# Patient Record
Sex: Male | Born: 1950 | Race: White | Hispanic: No | Marital: Married | Smoking: Never smoker
Health system: Southern US, Community
[De-identification: ages and names within clinical notes are randomized; demographics above are authoritative.]

---

## 2004-10-16 ENCOUNTER — Emergency Department (HOSPITAL_COMMUNITY): Admission: EM | Admit: 2004-10-16 | Discharge: 2004-10-16 | Payer: Self-pay | Admitting: Emergency Medicine

## 2007-08-10 ENCOUNTER — Ambulatory Visit (HOSPITAL_COMMUNITY): Admission: RE | Admit: 2007-08-10 | Discharge: 2007-08-10 | Payer: Self-pay | Admitting: Family Medicine

## 2007-08-19 ENCOUNTER — Inpatient Hospital Stay (HOSPITAL_COMMUNITY): Admission: RE | Admit: 2007-08-19 | Discharge: 2007-08-20 | Payer: Self-pay | Admitting: Orthopedic Surgery

## 2007-08-19 ENCOUNTER — Encounter (INDEPENDENT_AMBULATORY_CARE_PROVIDER_SITE_OTHER): Payer: Self-pay | Admitting: Orthopedic Surgery

## 2008-05-20 IMAGING — CR DG LUMBAR SPINE 1V
1 series · 1 of 1 positions shown · non-contrast
Comparison: none

CLINICAL DATA: L5-S1 herniated nucleus pulposus.  
 LUMBAR SPINE ? 1 VIEW:

[view not recorded]
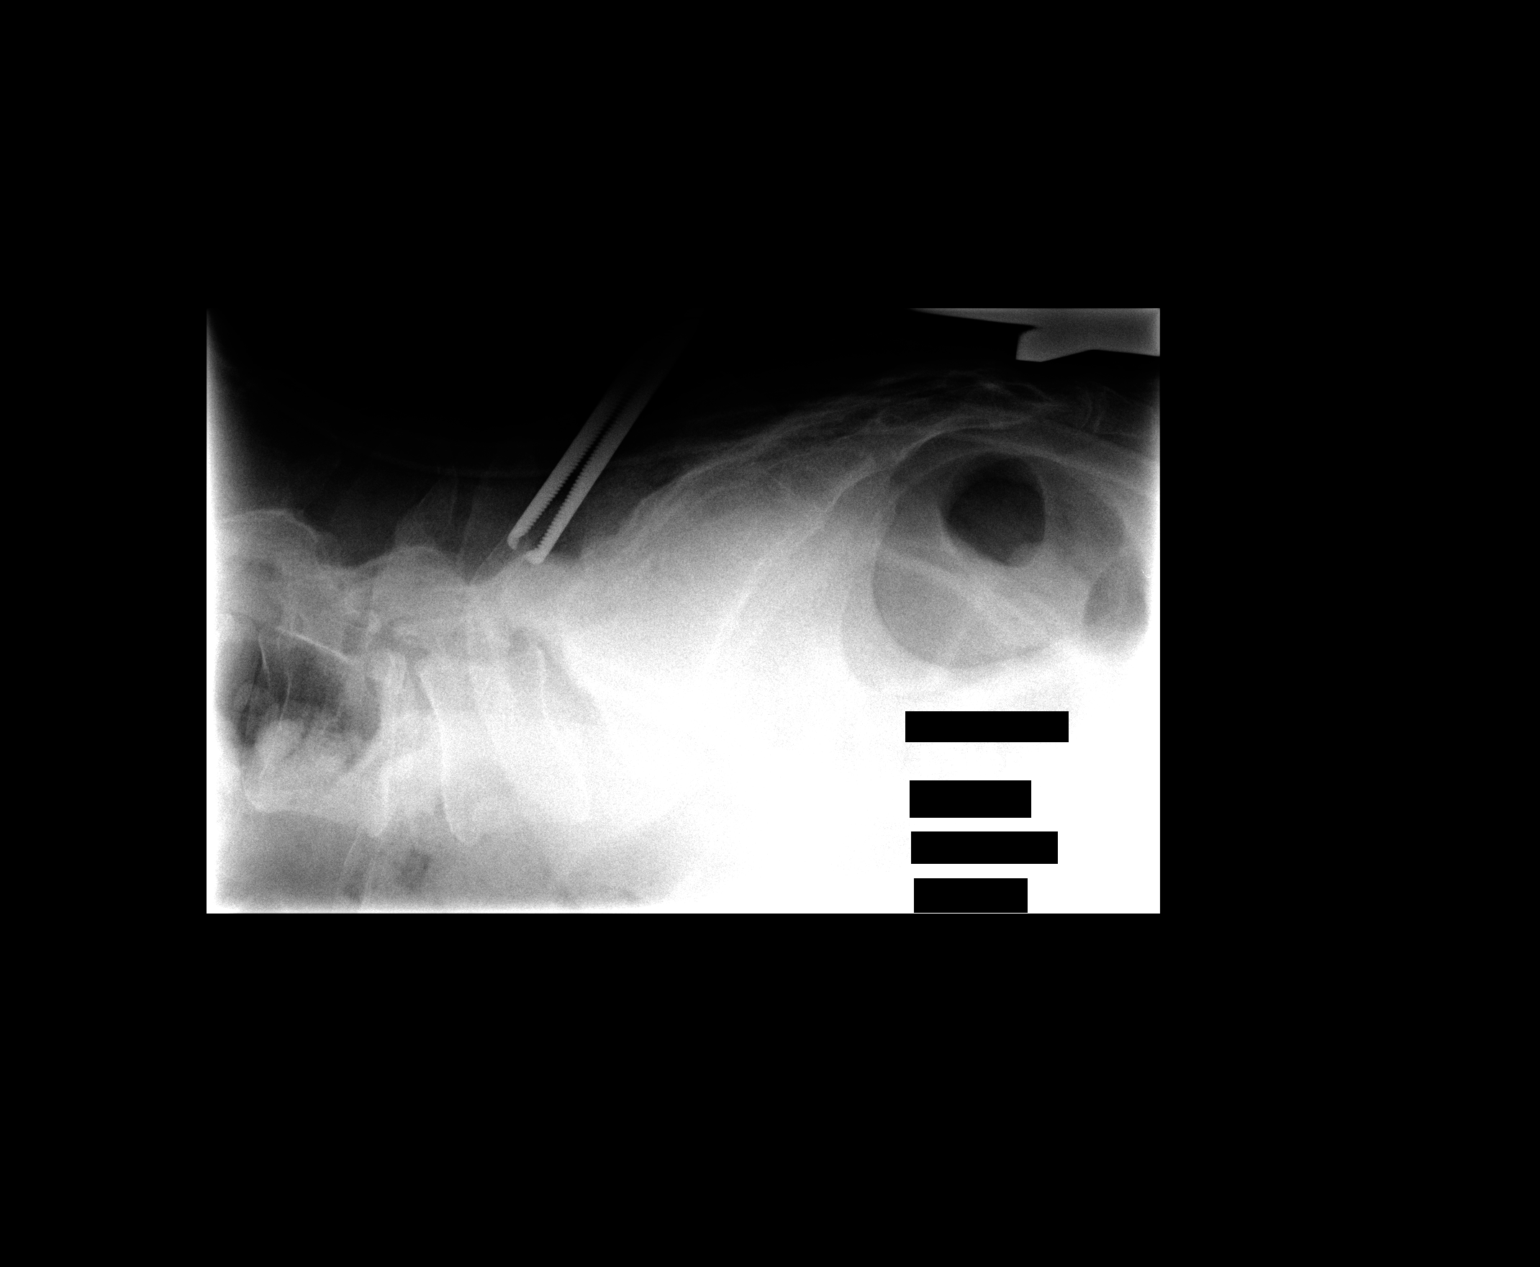

[1 of 1 positions shown; findings below may reference images not displayed]

FINDINGS: A single portable lateral projection demonstrates a radiopaque marker posterior to the L5-S1 interspace.
IMPRESSION: Radiopaque marker posterior to the L5-S1 interspace.

## 2009-03-11 ENCOUNTER — Encounter: Admission: RE | Admit: 2009-03-11 | Discharge: 2009-03-11 | Payer: Self-pay | Admitting: Orthopaedic Surgery

## 2011-01-13 NOTE — Discharge Summary (Signed)
[name], Michael Harrington NO.:  [id number]   MEDICAL RECORD NO.:  [id number]          PATIENT TYPE:  INP   LOCATION:  5010                         FACILITY:  [facility]   PHYSICIAN:  Nelda Severe, MD      DATE OF BIRTH:  08-08-1951   DATE OF ADMISSION:  08/19/2007  DATE OF DISCHARGE:  08/20/2007                               DISCHARGE SUMMARY   FINAL DIAGNOSIS:  Left L5-S1 disk herniation.   DISCHARGE INSTRUCTIONS:  -Avoid bending and lifting, remove dressing  tomorrow, may shower, may re-dress with a Band-Aid.   CONDITION ON DISCHARGE:  Stable and ambulatory.   DISCHARGE MEDICATIONS:  The patient already has  hydrocodone/acetaminophen (5/500) at home, which he can use for pain.  He is to by over-the-counter ibuprofen (Motrin or Advil) 200 mg and take  3 to 4 tablets every 8 hours for pain.  He is to buy Senokot at a drug  store and take that at night for constipation.   FOLLOW-UP INSTRUCTIONS:  He is to see me in the office in two weeks'  time.   COURSE IN HOSPITAL:  The date of admission, he was taken to the  operating room where an uncomplicated left L5-S1 laminotomy and disk  excision was carried out.  He awakened with complete relief of his left  sciatic pain.  On examination today, has no weakness in either lower  extremity.  His dressing is dry.  He is fit for discharge.   Also, he did describe to some swelling of his upper lip on the right  side and some dental pain in the region that he had bridgework done  previously.  Hopefully, this will settle down over the next few days.  He is not having any left leg pain at the moment.      Nelda Severe, MD  Electronically Signed     MT/MEDQ  D:  08/20/2007  T:  08/21/2007  Job:  [id number]

## 2011-01-13 NOTE — Op Note (Signed)
Michael Harrington, SWAIM NO.:  [id number]   MEDICAL RECORD NO.:  [id number]          PATIENT TYPE:  INP   LOCATION:  5010                         FACILITY:  [facility]   PHYSICIAN:  Nelda Severe, MD      DATE OF BIRTH:  [birth year]   DATE OF PROCEDURE:  08/19/2007  DATE OF DISCHARGE:  08/20/2007                               OPERATIVE REPORT   SURGEON:  Nelda Severe, M.D.   ASSISTANT:  OR staff.   PREOPERATIVE DIAGNOSIS:  L5-S1 disc herniation, left side.   POSTOPERATIVE DIAGNOSIS:  L5-S1 disc herniation, left side.   OPERATIVE PROCEDURE:  Left L5-S1 laminotomy and disc excision.   OPERATIVE FINDINGS:  The S1 nerve root was extremely swollen.  There  were two moderately large fragments of completely extruded disc, one at  and distal to the disc space and one coming out through a rent in the  superior annular insertion into L5 more proximally placed at the origin  of the S1 nerve root.   OPERATIVE NOTE:  The patient is placed under general endotracheal  anesthesia.  Intravenous antibiotics had been infused.  A Foley catheter  was not placed in the bladder.  He was positioned prone on the [place]  frame.  Care was taken to position the upper extremities so as to avoid  hyperflexion and abduction of the shoulders and so as to avoid  hyperflexion of the elbows.  The upper extremities were padded with foam  from axilla to hands.  The thighs, knees, shins, and ankles were  supported on pillows.  A skin marker was used to mark the proposed site  of incision at what I perceived to be the L5-S1 interspace, vertical  midline.  The lumbar area was prepped with DuraPrep and draped in a  rectangular fashion.  The drapes were secured with Ioban.   The skin was scored into the dermis with a scalpel and then the  subcutaneous tissue and thoracolumbar fascia injected with a mixture of  0.25% plain Marcaine and 1% lidocaine with epinephrine.  Dissection was  then carried down  onto the spinous process and what was perceived to be  interspinous interval at L5-S1 identified.  Paraspinal muscles were  mobilized to the left side at that level.  A cross table lateral  radiograph was taken with a Kocher on the trailing edge of the L5 lamina  and this finding confirmed by the radiologist.   Epifanio Lesches self retaining retractor was placed.  Soft tissue was  cleared from the ligamentum flavum and interspace at L5-S1 on the left.  The radiographic findings corresponded with my impression of the last  mobile segment, in fact, we were able to see enough of the dorsum of the  sacrum to know there was no articulation distal to the level identified.  We then used a high speed bur to extend the laminectomy proximally about  7-8 mm, to remove the medial 25% of the infra-articular process of L5  and the supra-articular process of S1.  An angled Karlin curette was  then used to detach the insertion of  the ligamentum flavum into the  upper lamina of S1.  Hence, we entered the spinal canal.  I then used  interposed cottonoid patties and extended the laminotomy a little  distally and then laterally into the lateral recess.  It was necessary  to thin down the supra-articular process a little more in order to be  able to divide it with a Kerrison and this was then done up to the  superior aspect of the supra-articular process of S1 (left).  The  ligamentum flavum was then excised.  A very huge S1 nerve root was  revealed.  This was fairly easily mobilized and retracted.  In the  course of sweeping the floor of the canal, particularly distal to the  space, I delivered a moderately large free fragment of disc from the  canal.  At this point, I was unable to determine the location of the  rent in the annulus through which it must have exited.   Eventually, I performed a posterolateral annulectomy about 3-4 mm in  diameter and proceeded to enucleate the disc using a combination of  down  pushing Epstein curettes, ring curettes, and pituitary rongeurs.  I was  still not satisfied that I had been actually able to locate enough disc  fragment to explain the MRI findings.  Further exploration proximally  revealed a fragment which was protruding out through a rent at the  insertion of the outer fibers of the annulus into the L5 body probably  about 7 mm from the midline.  This was then removed.  It was apparent  that there was a very bulging annulus at this level probably containing  a little endplate which had been detached from the inferior endplate of  L5.  I then removed more annulus at its insertion at L5 using Karlin  curettes.  This seemed to satisfactorily debulk the annulus and I felt  that there was no further need for decompression.  The disc space was  irrigated with sterile normal saline and no fragments floated out.  The  S1 nerve rate root, as mentioned above, was very swollen but well  decompressed.   Throughout the procedure, I bipolar coagulated any bleeding epidural  veins.  There was almost no ooze at all at the time of closure, but I  did inject FloSeal into the laminotomy defect.  The thoracolumbar lumbar  fascia was closed using interrupted figure-of-eight 0 Vicryl sutures.  The subcutaneous tissue was closed using interrupted 2-0 Vicryl sutures  inverted.  The skin was closed using subcuticular running 3-0 undyed  Vicryl suture.  The skin edges were reinforced with Steri-Strips.  A  bacitracin ointment dressing was then applied.  There were no  intraoperative complications.  The blood loss was estimated around 100  mL.  The sponge and needle counts were correct.      Nelda Severe, MD  Electronically Signed     MT/MEDQ  D:  08/19/2007  T:  08/20/2007  Job:  [id number]

## 2011-06-05 LAB — TYPE AND SCREEN
ABO/RH(D): O POS
Antibody Screen: NEGATIVE

## 2011-06-05 LAB — URINALYSIS, ROUTINE W REFLEX MICROSCOPIC
Hgb urine dipstick: NEGATIVE
Nitrite: NEGATIVE
Specific Gravity, Urine: 1.026
Urobilinogen, UA: 1
pH: 6

## 2011-06-05 LAB — COMPREHENSIVE METABOLIC PANEL
ALT: 22
AST: 24
Alkaline Phosphatase: 61
GFR calc non Af Amer: >60
Glucose, Bld: 101 — ABNORMAL HIGH
Potassium: 4.7
Sodium: 135
Total Bilirubin: 1.1

## 2011-06-05 LAB — DIFFERENTIAL
Monocytes Absolute: 0.8
Neutro Abs: 5.8
Neutrophils Relative %: 70

## 2011-06-05 LAB — PROTIME-INR
INR: 0.9
Prothrombin Time: 12.6

## 2011-06-05 LAB — URINE CULTURE: Culture: NO GROWTH

## 2011-06-05 LAB — CBC
MCV: 87.2
RDW: 12.7

## 2011-06-05 LAB — ABO/RH: ABO/RH(D): O POS

## 2018-09-01 ENCOUNTER — Emergency Department (HOSPITAL_COMMUNITY)
Admission: EM | Admit: 2018-09-01 | Discharge: 2018-09-01 | Disposition: A | Discharge status: Home / Self Care | Payer: BLUE CROSS/BLUE SHIELD | Attending: Emergency Medicine | Admitting: Emergency Medicine

## 2018-09-01 ENCOUNTER — Encounter (HOSPITAL_COMMUNITY): Payer: Self-pay | Admitting: *Deleted

## 2018-09-01 DIAGNOSIS — Z79899 Other long term (current) drug therapy: Secondary | ICD-10-CM | POA: Insufficient documentation

## 2018-09-01 DIAGNOSIS — I10 Essential (primary) hypertension: Secondary | ICD-10-CM | POA: Diagnosis not present

## 2018-09-01 DIAGNOSIS — R040 Epistaxis: Secondary | ICD-10-CM

## 2018-09-01 MED ORDER — ONDANSETRON HCL 4 MG/2ML IJ SOLN
4.0000 mg | Freq: Once | INTRAMUSCULAR | Status: AC
  Administered 2018-09-01: 4 mg via INTRAVENOUS
  Filled 2018-09-01: qty 2

## 2018-09-01 MED ORDER — LISINOPRIL 10 MG PO TABS: 10.0000 mg | ORAL_TABLET | Freq: Every day | ORAL | 1 refills | Status: AC

## 2018-09-01 MED ORDER — PANTOPRAZOLE SODIUM 40 MG IV SOLR
40.0000 mg | Freq: Once | INTRAVENOUS | Status: AC
  Administered 2018-09-01: 40 mg via INTRAVENOUS
  Filled 2018-09-01: qty 40

## 2018-09-01 MED ORDER — LISINOPRIL 10 MG PO TABS
10.0000 mg | ORAL_TABLET | Freq: Once | ORAL | Status: AC
  Administered 2018-09-01: 10 mg via ORAL
  Filled 2018-09-01: qty 1

## 2018-09-01 MED ORDER — SODIUM CHLORIDE 0.9 % IV BOLUS
1000.0000 mL | Freq: Once | INTRAVENOUS | Status: AC
  Administered 2018-09-01: 1000 mL via INTRAVENOUS

## 2018-09-01 MED ORDER — OXYMETAZOLINE HCL 0.05 % NA SOLN
1.0000 | Freq: Once | NASAL | Status: AC
Start: 2018-09-01 — End: 2018-09-01
  Administered 2018-09-01: 1 via NASAL
  Filled 2018-09-01: qty 15

## 2018-09-01 NOTE — ED Notes (Signed)
ED Provider at bedside. 

## 2018-09-01 NOTE — Discharge Instructions (Signed)
You will need to follow-up with an ear nose and throat doctor on Monday.  Number given for our doctor on call at [facility].  Try not to bend over, lift, sneeze, aggravate your nose.  I am going to start a blood pressure medication.  This can be followed up by your primary care doctor.

## 2018-09-01 NOTE — ED Provider Notes (Signed)
[name]   CSN: [id number] Arrival date & time: 09/01/18  [id number]     History   Chief Complaint Chief Complaint  Patient presents with  . Epistaxis    HPI Michael Harrington is a 68 y.o. male.  Level 5 caveat for acuity of condition.  Nosebleed from right nostril early this morning.  Patient does not have  chronic nosebleeds.  He states he has borderline hypertension.  No history of coagulopathy.  He does take Naprosyn for pain.     History reviewed. No pertinent past medical history.  There are no active problems to display for this patient.   History reviewed. No pertinent surgical history.      Home Medications    Prior to Admission medications   Medication Sig Start Date End Date Taking? Authorizing Provider  naproxen sodium (ALEVE) 220 MG tablet Take 220 mg by mouth daily as needed (pain).   Yes [provider]  lisinopril (PRINIVIL,ZESTRIL) 10 MG tablet Take 1 tablet (10 mg total) by mouth daily. 09/01/18   Donnetta Hutchingook, Neera Teng, MD    Family History History reviewed. No pertinent family history.  Social History Social History   Tobacco Use  . Smoking status: Never Smoker  . Smokeless tobacco: Never Used  Substance Use Topics  . Alcohol use: Not on file  . Drug use: Not on file     Allergies   Patient has no known allergies.   Review of Systems Review of Systems  All other systems reviewed and are negative.    Physical Exam Updated Vital Signs BP (!) 213/101   Pulse 74   Temp (!) 97.2 F (36.2 C) (Axillary) Comment: Unable to get pt's oral temp, due to pt's nosebleed  Resp 16   SpO2 97%   Physical Exam Vitals signs and nursing note reviewed.  Constitutional:      Appearance: He is well-developed.  HENT:     Head: Normocephalic and atraumatic.     Comments: Obvious bleeding from right nostril. Eyes:     Conjunctiva/sclera: Conjunctivae normal.  Neck:     Musculoskeletal: Neck  supple.  Cardiovascular:     Rate and Rhythm: Normal rate and regular rhythm.  Pulmonary:     Effort: Pulmonary effort is normal.     Breath sounds: Normal breath sounds.  Abdominal:     General: Bowel sounds are normal.     Palpations: Abdomen is soft.  Musculoskeletal: Normal range of motion.  Skin:    General: Skin is warm and dry.  Neurological:     Mental Status: He is alert and oriented to person, place, and time.  Psychiatric:        Behavior: Behavior normal.      ED Treatments / Results  Labs (all labs ordered are listed, but only abnormal results are displayed) Labs Reviewed - No data to display  EKG None  Radiology No results found.  Procedures .Epistaxis Management Date/Time: 09/01/2018 2:37 PM Performed by: Donnetta Hutchingook, Torry Adamczak, MD Authorized by: Donnetta Hutchingook, Robyn Nohr, MD   Consent:    Consent obtained:  Verbal   Consent given by:  Patient   Risks discussed:  Bleeding and nasal injury Anesthesia (see MAR for exact dosages):    Anesthesia method:  Topical application   Topical anesthesia: afrin. Procedure details:    Treatment site:  R posterior   Treatment method:  Nasal tampon   Treatment complexity:  Extensive   Treatment episode: initial   Post-procedure  details:    Assessment:  Bleeding decreased   (including critical care time)  Medications Ordered in ED Medications  oxymetazoline (AFRIN) 0.05 % nasal spray 1 spray (1 spray Each Nare Given 09/01/18 1043)  sodium chloride 0.9 % bolus 1,000 mL (0 mLs Intravenous Stopped 09/01/18 1220)  ondansetron (ZOFRAN) injection 4 mg (4 mg Intravenous Given 09/01/18 1110)  pantoprazole (PROTONIX) injection 40 mg (40 mg Intravenous Given 09/01/18 1110)  lisinopril (PRINIVIL,ZESTRIL) tablet 10 mg (10 mg Oral Given 09/01/18 1426)     Initial Impression / Assessment and Plan / ED Course  I have reviewed the triage vital signs and the nursing notes.  Pertinent labs & imaging results that were available during my care of the patient  were reviewed by me and considered in my medical decision making (see chart for details).     Patient treated acutely for a vigorous [predominately posterior] right nosebleed.  He was observed for 2 to 3 hours with no change in his status.  IV fluids, IV Zofran, IV Protonix administered for nausea secondary to swallowing blood.  His blood pressure has remained elevated.  Will start lisinopril 10 mg.  He has primary care follow-up.  Referral to ENT.  Final Clinical Impressions(s) / ED Diagnoses   Final diagnoses:  Right-sided epistaxis  Hypertension, unspecified type    ED Discharge Orders         Ordered    lisinopril (PRINIVIL,ZESTRIL) 10 MG tablet  Daily     09/01/18 1429           Donnetta Hutching, MD 09/01/18 1439

## 2018-09-01 NOTE — ED Triage Notes (Signed)
Pt in c/o nosebleed that started 30 minutes ago, still actively bleeding and states it is running down the back of his throat and gagging him
